# Patient Record
Sex: Female | Born: 1995 | Race: White | Hispanic: No | Marital: Married | Smoking: Never smoker
Health system: Southern US, Community
[De-identification: ages and names within clinical notes are randomized; demographics above are authoritative.]

## PROBLEM LIST (undated history)

## (undated) DIAGNOSIS — G43909 Migraine, unspecified, not intractable, without status migrainosus: Secondary | ICD-10-CM

## (undated) DIAGNOSIS — S060X9A Concussion with loss of consciousness of unspecified duration, initial encounter: Secondary | ICD-10-CM

## (undated) DIAGNOSIS — S060XAA Concussion with loss of consciousness status unknown, initial encounter: Secondary | ICD-10-CM

## (undated) DIAGNOSIS — T7840XA Allergy, unspecified, initial encounter: Secondary | ICD-10-CM

## (undated) DIAGNOSIS — J45909 Unspecified asthma, uncomplicated: Secondary | ICD-10-CM

## (undated) DIAGNOSIS — R000 Tachycardia, unspecified: Secondary | ICD-10-CM

## (undated) DIAGNOSIS — N926 Irregular menstruation, unspecified: Secondary | ICD-10-CM

## (undated) DIAGNOSIS — I10 Essential (primary) hypertension: Secondary | ICD-10-CM

## (undated) HISTORY — DX: Migraine, unspecified, not intractable, without status migrainosus: G43.909

## (undated) HISTORY — DX: Allergy, unspecified, initial encounter: T78.40XA

## (undated) HISTORY — PX: WISDOM TOOTH EXTRACTION: SHX21

## (undated) HISTORY — DX: Concussion with loss of consciousness of unspecified duration, initial encounter: S06.0X9A

## (undated) HISTORY — DX: Tachycardia, unspecified: R00.0

## (undated) HISTORY — DX: Unspecified asthma, uncomplicated: J45.909

## (undated) HISTORY — DX: Essential (primary) hypertension: I10

## (undated) HISTORY — DX: Concussion with loss of consciousness status unknown, initial encounter: S06.0XAA

## (undated) HISTORY — DX: Irregular menstruation, unspecified: N92.6

---
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
## 2015-03-10 ENCOUNTER — Ambulatory Visit (INDEPENDENT_AMBULATORY_CARE_PROVIDER_SITE_OTHER): Payer: 59 | Admitting: Physician Assistant

## 2015-03-10 ENCOUNTER — Ambulatory Visit
Admission: RE | Admit: 2015-03-10 | Discharge: 2015-03-10 | Disposition: A | Discharge status: Home / Self Care | Payer: 59 | Source: Ambulatory Visit | Attending: Physician Assistant | Admitting: Physician Assistant

## 2015-03-10 ENCOUNTER — Encounter: Payer: Self-pay | Admitting: Physician Assistant

## 2015-03-10 VITALS — BP 120/62 | HR 90 | Temp 98.7°F | Resp 16 | Wt 132.0 lb

## 2015-03-10 DIAGNOSIS — J45909 Unspecified asthma, uncomplicated: Secondary | ICD-10-CM | POA: Insufficient documentation

## 2015-03-10 DIAGNOSIS — R1032 Left lower quadrant pain: Secondary | ICD-10-CM

## 2015-03-10 DIAGNOSIS — S060X9A Concussion with loss of consciousness of unspecified duration, initial encounter: Secondary | ICD-10-CM | POA: Insufficient documentation

## 2015-03-10 DIAGNOSIS — R110 Nausea: Secondary | ICD-10-CM

## 2015-03-10 DIAGNOSIS — N926 Irregular menstruation, unspecified: Secondary | ICD-10-CM

## 2015-03-10 DIAGNOSIS — Z23 Encounter for immunization: Secondary | ICD-10-CM

## 2015-03-10 DIAGNOSIS — S139XXA Sprain of joints and ligaments of unspecified parts of neck, initial encounter: Secondary | ICD-10-CM | POA: Insufficient documentation

## 2015-03-10 DIAGNOSIS — R195 Other fecal abnormalities: Secondary | ICD-10-CM

## 2015-03-10 DIAGNOSIS — S060XAA Concussion with loss of consciousness status unknown, initial encounter: Secondary | ICD-10-CM | POA: Insufficient documentation

## 2015-03-10 DIAGNOSIS — S161XXA Strain of muscle, fascia and tendon at neck level, initial encounter: Secondary | ICD-10-CM | POA: Insufficient documentation

## 2015-03-10 DIAGNOSIS — J309 Allergic rhinitis, unspecified: Secondary | ICD-10-CM | POA: Insufficient documentation

## 2015-03-10 DIAGNOSIS — G43909 Migraine, unspecified, not intractable, without status migrainosus: Secondary | ICD-10-CM | POA: Insufficient documentation

## 2015-03-10 DIAGNOSIS — R030 Elevated blood-pressure reading, without diagnosis of hypertension: Secondary | ICD-10-CM | POA: Insufficient documentation

## 2015-03-10 DIAGNOSIS — IMO0002 Reserved for concepts with insufficient information to code with codable children: Secondary | ICD-10-CM | POA: Insufficient documentation

## 2015-03-10 DIAGNOSIS — R000 Tachycardia, unspecified: Secondary | ICD-10-CM | POA: Insufficient documentation

## 2015-03-10 MED ORDER — ESOMEPRAZOLE MAGNESIUM 20 MG PO CPDR: 20.0000 mg | DELAYED_RELEASE_CAPSULE | Freq: Every day | ORAL | Status: DC

## 2015-03-10 NOTE — Progress Notes (Signed)
Patient: April Griffin Female    DOB: 08/18/1995   19 y.o.   MRN: [id number] Visit Date: 03/10/2015  Today's Provider: Margaretann Loveless, PA-C   Chief Complaint  Patient presents with  . Nausea   Subjective:    HPI  April Griffin is a 20 yr old female that is here today with concerns about feeling nauseous every time she eats for the past couple of months, since November 2016. Patient feels like she is constantly hungry but gets sick after only eating a few bites. Per patient she has lost 10 lbs. Since November.  Patient states that she feels tired and that she is not trying to loose weight and try to eat. Per patient her menstrual cycle has been irregular in the past four months also. She has had irregular menses before and was placed on OCP but had her BP increase with that treatment. She also states that when she weightd approx 135-140 pounds her menstrual cycle was regular. Patient is still working out for the past couple of years same with the diet. No possibility of pregnancy, per patient is not sexually active in the past 2 years.      Allergies  Allergen Reactions  . Bee Venom   . Flexeril  [Cyclobenzaprine Hcl]     Tachycardia  . Latex    Previous Medications   MULTIPLE VITAMIN PO    Take by mouth.    Review of Systems  Constitutional: Positive for fatigue. Negative for fever and chills.  HENT: Negative.   Eyes: Negative.   Respiratory: Negative.   Cardiovascular: Negative.   Gastrointestinal: Positive for nausea, abdominal pain and diarrhea. Negative for vomiting (none recent, but maybe twice since November), constipation and blood in stool.  Endocrine: Negative.   Genitourinary: Positive for menstrual problem. Negative for dysuria, urgency, frequency, hematuria, flank pain, decreased urine volume, vaginal bleeding, vaginal discharge, genital sores, vaginal pain and pelvic pain.  Musculoskeletal: Negative.   Allergic/Immunologic: Negative.   Hematological:  Negative.   Psychiatric/Behavioral: Negative.   All other systems reviewed and are negative.   Social History  Substance Use Topics  . Smoking status: Never Smoker   . Smokeless tobacco: Never Used  . Alcohol Use: No   Objective:   BP 120/62 mmHg  Pulse 90  Temp(Src) 98.7 F (37.1 C) (Oral)  Resp 16  Wt 132 lb (59.875 kg)  LMP 02/24/2015  Physical Exam  Constitutional: She is oriented to person, place, and time. She appears well-developed and well-nourished. No distress.  HENT:  Head: Normocephalic and atraumatic.  Right Ear: External ear normal.  Left Ear: External ear normal.  Nose: Nose normal.  Mouth/Throat: Oropharynx is clear and moist. No oropharyngeal exudate.  Eyes: Conjunctivae and EOM are normal. Pupils are equal, round, and reactive to light. Right eye exhibits no discharge. Left eye exhibits no discharge. No scleral icterus.  Neck: Normal range of motion. Neck supple. No JVD present. No tracheal deviation present. No thyromegaly present.  Cardiovascular: Normal rate, regular rhythm, normal heart sounds and intact distal pulses.  Exam reveals no gallop and no friction rub.   No murmur heard. Pulmonary/Chest: Effort normal and breath sounds normal. No respiratory distress. She has no wheezes. She has no rales. She exhibits no tenderness.  Abdominal: Soft. Bowel sounds are normal. She exhibits no distension, no abdominal bruit, no ascites, no pulsatile midline mass and no mass. There is no hepatosplenomegaly. There is tenderness in the suprapubic area  and left lower quadrant. There is no rebound, no guarding and no CVA tenderness.  Musculoskeletal: Normal range of motion. She exhibits no edema or tenderness.  Lymphadenopathy:    She has no cervical adenopathy.  Neurological: She is alert and oriented to person, place, and time.  Skin: Skin is warm and dry. No rash noted. She is not diaphoretic.  Psychiatric: She has a normal mood and affect. Her behavior is normal.  Judgment and thought content normal.  Vitals reviewed.       Assessment & Plan:     1. Nausea Worsening and becoming more frequent. I will check labs as below and get an x-ray to rule out constipation. I will also go ahead and empirically start with Nexium in case there is a possible gastritis or stomach or duodenal ulcer as the cause. We will try the Nexium for 2-4 weeks to see if there is any improvement in symptoms. If there is not she is to call the office and we will proceed with GI referral for further evaluation and treatment. I did also give her 3 Hemoccult cards for her to do at home. I did offer her a rectal exam today in the office but she deferred. She may bring these back to the office once completed. Differential diagnosis includes constipation, gastric or duodenal ulcer, enteritis, inflammatory bowel disease such as Crohn's or ulcerative colitis, or irritable bowel syndrome. I will follow-up with her in 4 weeks to reevaluate. She is to call the office if symptoms worsen in the meantime. - CBC With Differential - Comprehensive Metabolic Panel (CMET) - Lipase - Amylase - DG Abd 2 Views; Future - esomeprazole (NEXIUM) 20 MG capsule; Take 1 capsule (20 mg total) by mouth daily.  Dispense: 30 capsule; Refill: 0  2. LLQ abdominal pain See above medical treatment plan. - DG Abd 2 Views; Future - esomeprazole (NEXIUM) 20 MG capsule; Take 1 capsule (20 mg total) by mouth daily.  Dispense: 30 capsule; Refill: 0  3. Loose stools See above medical treatment plan. - DG Abd 2 Views; Future  4. Irregular menses Possible cause of lower abdominal pain. She states that she has previously been on oral contraceptives and does not wish to go back on those at this time. She also does not wish to have any implants either. I will be checking labs to make sure that she has not developed any anemia from this. May consider further evaluation for uterine fibroids or other cause if all other stomach  evaluation is negative and symptoms persist.  5. Need for influenza vaccination Flu vaccine given today without complication. - Flu Vaccine QUAD 36+ mos IM       Margaretann Loveless, PA-C  [facility]

## 2015-03-10 NOTE — Patient Instructions (Signed)

## 2015-03-11 ENCOUNTER — Telehealth: Payer: Self-pay

## 2015-03-11 LAB — COMPREHENSIVE METABOLIC PANEL
ALT: 11 IU/L (ref 0–32)
AST: 18 IU/L (ref 0–40)
Albumin/Globulin Ratio: 1.7 (ref 1.1–2.5)
Albumin: 4.7 g/dL (ref 3.5–5.5)
Alkaline Phosphatase: 77 IU/L (ref 39–117)
BUN/Creatinine Ratio: 17 (ref 8–20)
BUN: 12 mg/dL (ref 6–20)
Bilirubin Total: <0.2 mg/dL (ref 0.0–1.2)
CO2: 25 mmol/L (ref 18–29)
Calcium: 9.3 mg/dL (ref 8.7–10.2)
Chloride: 103 mmol/L (ref 96–106)
Creatinine, Ser: 0.72 mg/dL (ref 0.57–1.00)
GFR calc Af Amer: 139 mL/min/{1.73_m2} (ref >59)
GFR calc non Af Amer: 121 mL/min/{1.73_m2} (ref >59)
Globulin, Total: 2.7 g/dL (ref 1.5–4.5)
Glucose: 86 mg/dL (ref 65–99)
Potassium: 4.5 mmol/L (ref 3.5–5.2)
Sodium: 144 mmol/L (ref 134–144)
Total Protein: 7.4 g/dL (ref 6.0–8.5)

## 2015-03-11 LAB — CBC WITH DIFFERENTIAL
Basophils Absolute: 0.1 10*3/uL (ref 0.0–0.2)
Basos: 1 %
EOS (ABSOLUTE): 0.2 10*3/uL (ref 0.0–0.4)
Eos: 2 %
Hematocrit: 40.7 % (ref 34.0–46.6)
Hemoglobin: 13.3 g/dL (ref 11.1–15.9)
Immature Grans (Abs): 0 10*3/uL (ref 0.0–0.1)
Immature Granulocytes: 0 %
Lymphocytes Absolute: 1.9 10*3/uL (ref 0.7–3.1)
Lymphs: 23 %
MCH: 28.5 pg (ref 26.6–33.0)
MCHC: 32.7 g/dL (ref 31.5–35.7)
MCV: 87 fL (ref 79–97)
Monocytes Absolute: 0.7 10*3/uL (ref 0.1–0.9)
Monocytes: 9 %
Neutrophils Absolute: 5.4 10*3/uL (ref 1.4–7.0)
Neutrophils: 65 %
RBC: 4.66 x10E6/uL (ref 3.77–5.28)
RDW: 13.8 % (ref 12.3–15.4)
WBC: 8.2 10*3/uL (ref 3.4–10.8)

## 2015-03-11 LAB — LIPASE: Lipase: 26 U/L (ref 0–59)

## 2015-03-11 LAB — AMYLASE: Amylase: 37 U/L (ref 31–124)

## 2015-03-11 NOTE — Telephone Encounter (Signed)
-----   Message from Margaretann Loveless, PA-C sent at 03/11/2015  8:18 AM EST ----- Labs are WNL.

## 2015-03-11 NOTE — Telephone Encounter (Signed)
Patient advised as directed below.  Thanks,  -Joseline 

## 2015-04-07 ENCOUNTER — Ambulatory Visit (INDEPENDENT_AMBULATORY_CARE_PROVIDER_SITE_OTHER): Payer: 59 | Admitting: Physician Assistant

## 2015-04-07 ENCOUNTER — Encounter: Payer: Self-pay | Admitting: Physician Assistant

## 2015-04-07 VITALS — BP 110/60 | HR 80 | Temp 98.4°F | Resp 16 | Wt 134.2 lb

## 2015-04-07 DIAGNOSIS — Z113 Encounter for screening for infections with a predominantly sexual mode of transmission: Secondary | ICD-10-CM | POA: Diagnosis not present

## 2015-04-07 DIAGNOSIS — K5900 Constipation, unspecified: Secondary | ICD-10-CM

## 2015-04-07 NOTE — Progress Notes (Signed)
Patient: April Griffin Female    DOB: [birth year]   20 y.o.   MRN: [id number] Visit Date: 04/07/2015  Today's Provider: Margaretann Loveless, PA-C   Chief Complaint  Patient presents with  . Follow-up    Nausea   Subjective:    HPI April Griffin is here for her 4 week follow-up appointment. She reports her nausea and her abdominal pain is better. She does get nauseous sometimes specially when waits long to eat. Feels a lot better however. Menstrual cycle is trying to regulate again.  She has not taken Nexium. She did use MiraLAX for a couple of days but did not like the way it made her feel. She has been controlling her bowel movements with dietary changes. She also states that she does have a new partner. They have not been sexually active as of yet but she would like to get tested for STDs prior to any sexual activity with him so that she knows that she is okay before entering a sexual relationship. She has been sexually active in the past but has been a couple of years since her last sexual encounter.    Allergies  Allergen Reactions  . Bee Venom   . Flexeril  [Cyclobenzaprine Hcl]     Tachycardia  . Latex    Previous Medications   ESOMEPRAZOLE (NEXIUM) 20 MG CAPSULE    Take 1 capsule (20 mg total) by mouth daily.   MULTIPLE VITAMIN PO    Take by mouth. Reported on 04/07/2015    Review of Systems  Constitutional: Negative.   HENT: Negative.   Respiratory: Negative.   Cardiovascular: Negative.   Gastrointestinal: Positive for nausea (improving). Negative for abdominal pain.  Genitourinary: Negative.   Neurological: Negative.     Social History  Substance Use Topics  . Smoking status: Never Smoker   . Smokeless tobacco: Never Used  . Alcohol Use: No   Objective:   BP 110/60 mmHg  Pulse 80  Temp(Src) 98.4 F (36.9 C) (Oral)  Resp 16  Wt 134 lb 3.2 oz (60.873 kg)  LMP 03/27/2015  Physical Exam  Constitutional: She is oriented to person, place, and time. She  appears well-developed and well-nourished. No distress.  Cardiovascular: Normal rate, regular rhythm and normal heart sounds.  Exam reveals no gallop and no friction rub.   No murmur heard. Pulmonary/Chest: Effort normal and breath sounds normal. No respiratory distress. She has no wheezes. She has no rales.  Abdominal: Soft. Normal appearance and bowel sounds are normal. She exhibits no distension and no mass. There is no hepatosplenomegaly. There is no tenderness. There is no rebound, no guarding and no CVA tenderness.  Neurological: She is alert and oriented to person, place, and time.  Skin: Skin is warm and dry. She is not diaphoretic.        Assessment & Plan:     1. Screen for STD (sexually transmitted disease) Will perform STD testing as below and follow-up with her pending these results. I will see her back in 4-5 months for her complete annual physical including initial Pap smear at that time as well. She is to call the office if she has any acute issues, questions or concerns in the meantime. - HIV antibody (with reflex) - GC/Chlamydia Probe Amp - HSV(herpes simplex vrs) 1+2 ab-IgG  2. Constipation, unspecified constipation type Improving. At this time she is only using diet changes to improve her bowel habits. She has increased her water  intake as well. I did advise her that she could always add a stool softener instead of a laxative to help keep her bowel movements regular as well. She is to call the office if symptoms worsen again.       Margaretann Loveless, PA-C  [facility]

## 2015-04-08 LAB — HSV(HERPES SIMPLEX VRS) I + II AB-IGG
HSV 1 Glycoprotein G Ab, IgG: <0.91 index (ref 0.00–0.90)
HSV 2 Glycoprotein G Ab, IgG: <0.91 index (ref 0.00–0.90)

## 2015-04-08 LAB — HIV ANTIBODY (ROUTINE TESTING W REFLEX): HIV Screen 4th Generation wRfx: NONREACTIVE

## 2015-04-08 LAB — GC/CHLAMYDIA PROBE AMP
Chlamydia trachomatis, NAA: NEGATIVE
Neisseria gonorrhoeae by PCR: NEGATIVE

## 2015-04-09 ENCOUNTER — Telehealth: Payer: Self-pay

## 2015-04-09 NOTE — Telephone Encounter (Signed)
Patient advised as directed below.  Thanks,  -Joseline 

## 2015-04-09 NOTE — Telephone Encounter (Signed)
-----   Message from Margaretann Loveless, [place] sent at 04/08/2015  5:22 PM EST ----- GC/chlamydia negative also

## 2015-08-07 ENCOUNTER — Encounter: Payer: 59 | Admitting: Physician Assistant

## 2017-11-02 DIAGNOSIS — L858 Other specified epidermal thickening: Secondary | ICD-10-CM | POA: Diagnosis not present

## 2017-11-02 DIAGNOSIS — D1801 Hemangioma of skin and subcutaneous tissue: Secondary | ICD-10-CM | POA: Diagnosis not present

## 2017-11-02 DIAGNOSIS — D1800 Hemangioma unspecified site: Secondary | ICD-10-CM | POA: Diagnosis not present

## 2018-04-25 ENCOUNTER — Encounter: Payer: 59 | Admitting: Physician Assistant
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
## 2018-05-02 ENCOUNTER — Other Ambulatory Visit: Payer: Self-pay | Admitting: Physician Assistant

## 2018-05-02 ENCOUNTER — Other Ambulatory Visit: Payer: Self-pay

## 2018-05-02 ENCOUNTER — Encounter: Payer: Self-pay | Admitting: Physician Assistant

## 2018-05-02 ENCOUNTER — Ambulatory Visit (INDEPENDENT_AMBULATORY_CARE_PROVIDER_SITE_OTHER): Payer: BLUE CROSS/BLUE SHIELD | Admitting: Physician Assistant

## 2018-05-02 VITALS — BP 118/78 | HR 76 | Temp 98.6°F | Ht 65.0 in | Wt 136.0 lb

## 2018-05-02 DIAGNOSIS — Z0000 Encounter for general adult medical examination without abnormal findings: Secondary | ICD-10-CM

## 2018-05-02 DIAGNOSIS — Z124 Encounter for screening for malignant neoplasm of cervix: Secondary | ICD-10-CM | POA: Diagnosis not present

## 2018-05-02 DIAGNOSIS — E282 Polycystic ovarian syndrome: Secondary | ICD-10-CM | POA: Diagnosis not present

## 2018-05-02 DIAGNOSIS — R102 Pelvic and perineal pain: Secondary | ICD-10-CM | POA: Diagnosis not present

## 2018-05-02 NOTE — Patient Instructions (Signed)

## 2018-05-03 LAB — CBC WITH DIFFERENTIAL/PLATELET
Basophils Absolute: 0.1 10*3/uL (ref 0.0–0.2)
Basos: 1 %
EOS (ABSOLUTE): 0.1 10*3/uL (ref 0.0–0.4)
Eos: 1 %
Hematocrit: 38.8 % (ref 34.0–46.6)
Hemoglobin: 13.5 g/dL (ref 11.1–15.9)
Immature Grans (Abs): 0 10*3/uL (ref 0.0–0.1)
Immature Granulocytes: 0 %
Lymphocytes Absolute: 1.8 10*3/uL (ref 0.7–3.1)
Lymphs: 36 %
MCH: 30.8 pg (ref 26.6–33.0)
MCHC: 34.8 g/dL (ref 31.5–35.7)
MCV: 89 fL (ref 79–97)
Monocytes Absolute: 0.4 10*3/uL (ref 0.1–0.9)
Monocytes: 8 %
Neutrophils Absolute: 2.7 10*3/uL (ref 1.4–7.0)
Neutrophils: 54 %
Platelets: 187 10*3/uL (ref 150–450)
RBC: 4.38 x10E6/uL (ref 3.77–5.28)
RDW: 12.3 % (ref 11.7–15.4)
WBC: 5 10*3/uL (ref 3.4–10.8)

## 2018-05-03 LAB — COMPREHENSIVE METABOLIC PANEL
ALT: 9 IU/L (ref 0–32)
AST: 15 IU/L (ref 0–40)
Albumin/Globulin Ratio: 1.8 (ref 1.2–2.2)
Albumin: 4.4 g/dL (ref 3.9–5.0)
Alkaline Phosphatase: 72 IU/L (ref 39–117)
BUN/Creatinine Ratio: 13 (ref 9–23)
BUN: 9 mg/dL (ref 6–20)
Bilirubin Total: 0.3 mg/dL (ref 0.0–1.2)
CO2: 23 mmol/L (ref 20–29)
Calcium: 8.7 mg/dL (ref 8.7–10.2)
Chloride: 103 mmol/L (ref 96–106)
Creatinine, Ser: 0.68 mg/dL (ref 0.57–1.00)
GFR calc Af Amer: 143 mL/min/{1.73_m2} (ref >59)
GFR calc non Af Amer: 124 mL/min/{1.73_m2} (ref >59)
Globulin, Total: 2.5 g/dL (ref 1.5–4.5)
Glucose: 88 mg/dL (ref 65–99)
Potassium: 4 mmol/L (ref 3.5–5.2)
Sodium: 141 mmol/L (ref 134–144)
Total Protein: 6.9 g/dL (ref 6.0–8.5)

## 2018-05-03 LAB — LIPID PANEL WITH LDL/HDL RATIO
Cholesterol, Total: 138 mg/dL (ref 100–199)
HDL: 62 mg/dL (ref >39)
LDL Calculated: 62 mg/dL (ref 0–99)
LDl/HDL Ratio: 1 ratio (ref 0.0–3.2)
Triglycerides: 70 mg/dL (ref 0–149)
VLDL Cholesterol Cal: 14 mg/dL (ref 5–40)

## 2018-05-03 LAB — TSH: TSH: 1.25 u[IU]/mL (ref 0.450–4.500)

## 2018-05-04 ENCOUNTER — Telehealth: Payer: Self-pay

## 2018-05-04 NOTE — Telephone Encounter (Signed)
Left detailed vm.  Form filled out and faxed and copy if ready for patient.

## 2018-05-04 NOTE — Telephone Encounter (Signed)
-----   Message from Margaretann Loveless, [place] sent at 05/04/2018  8:35 AM EDT ----- Blood count is normal. Kidney and liver function is normal. Sugar normal. Cholesterol normal. Thyroid is normal.

## 2018-05-04 NOTE — Telephone Encounter (Signed)
[id number] 

## 2018-05-04 NOTE — Telephone Encounter (Signed)
Pt Advised of labs.  She had paperwork left that needed to be filled out.  Please call when ready.

## 2018-05-07 ENCOUNTER — Other Ambulatory Visit: Payer: Self-pay | Admitting: Physician Assistant

## 2018-05-07 ENCOUNTER — Ambulatory Visit
Admission: RE | Admit: 2018-05-07 | Discharge: 2018-05-07 | Disposition: A | Discharge status: Home / Self Care | Payer: BLUE CROSS/BLUE SHIELD | Source: Ambulatory Visit | Attending: Physician Assistant | Admitting: Physician Assistant

## 2018-05-07 ENCOUNTER — Telehealth: Payer: Self-pay

## 2018-05-07 ENCOUNTER — Other Ambulatory Visit: Payer: Self-pay

## 2018-05-07 DIAGNOSIS — E282 Polycystic ovarian syndrome: Secondary | ICD-10-CM | POA: Diagnosis not present

## 2018-05-07 DIAGNOSIS — R102 Pelvic and perineal pain: Secondary | ICD-10-CM | POA: Diagnosis not present

## 2018-05-07 NOTE — Telephone Encounter (Signed)
-----   Message from Margaretann Loveless, [place] sent at 05/07/2018  1:26 PM EDT ----- Pelvic US is unremarkable. No ovarian cyst or uterine fibroids noted now. Are you still having the RLQ and pelvic abdominal pain.

## 2018-05-07 NOTE — Telephone Encounter (Signed)
[email or web address]

## 2018-05-09 LAB — PAPLB, CTNGTV, HPV, RFX16/18
Chlamydia, Nuc. Acid Amp: NEGATIVE
Gonococcus, Nuc. Acid Amp: NEGATIVE
HPV, high-risk: NEGATIVE
PAP Smear Comment: 0
Trich vag by NAA: NEGATIVE

## 2018-05-09 NOTE — Telephone Encounter (Signed)
Patient advised as below.  

## 2018-05-16 ENCOUNTER — Telehealth: Payer: Self-pay

## 2018-05-16 NOTE — Telephone Encounter (Signed)
-----   Message from Margaretann Loveless, [place] sent at 05/14/2018  3:51 PM EDT ----- Pap is normal.  Will repeat in 3 years.

## 2018-05-16 NOTE — Telephone Encounter (Signed)
[email or web address]

## 2018-05-17 NOTE — Telephone Encounter (Signed)
Spoke to patient about lab results. She will follow up in 3 to 5 years for next exam.

## 2019-03-19 DIAGNOSIS — D485 Neoplasm of uncertain behavior of skin: Secondary | ICD-10-CM | POA: Diagnosis not present

## 2019-03-19 DIAGNOSIS — L720 Epidermal cyst: Secondary | ICD-10-CM | POA: Diagnosis not present

## 2019-03-19 DIAGNOSIS — D229 Melanocytic nevi, unspecified: Secondary | ICD-10-CM | POA: Diagnosis not present

## 2019-04-04 DIAGNOSIS — D235 Other benign neoplasm of skin of trunk: Secondary | ICD-10-CM | POA: Diagnosis not present

## 2019-04-08 ENCOUNTER — Other Ambulatory Visit: Payer: Self-pay

## 2019-04-08 ENCOUNTER — Ambulatory Visit
Admission: EM | Admit: 2019-04-08 | Discharge: 2019-04-08 | Disposition: A | Discharge status: Home / Self Care | Payer: BC Managed Care – PPO | Attending: Family Medicine | Admitting: Family Medicine

## 2019-04-08 DIAGNOSIS — T8149XA Infection following a procedure, other surgical site, initial encounter: Secondary | ICD-10-CM | POA: Diagnosis not present

## 2019-04-08 MED ORDER — ONDANSETRON 4 MG PO TBDP: 4.0000 mg | ORAL_TABLET | Freq: Three times a day (TID) | ORAL | 0 refills | Status: AC | PRN

## 2019-04-08 MED ORDER — DOXYCYCLINE HYCLATE 100 MG PO CAPS: 100.0000 mg | ORAL_CAPSULE | Freq: Two times a day (BID) | ORAL | 0 refills | Status: DC

## 2019-04-08 MED ORDER — ONDANSETRON 4 MG PO TBDP: 4.0000 mg | ORAL_TABLET | Freq: Three times a day (TID) | ORAL | 0 refills | Status: DC | PRN
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
## 2019-04-08 NOTE — ED Triage Notes (Signed)
Pt had dermatological procedure done on abd for some skin cancer on Thursday. Started Friday with pain, swelling, discharge to procedure site on her abdomen.

## 2019-04-08 NOTE — Discharge Instructions (Addendum)
Care for wound as directed by your dermatologist. Keep covered to keep clean I have collected a culture to ensure adequate antibiotic coverage.  I would recommend following up with your dermatologist in the next week for a recheck.  If worsening don't hesitate to return.  The antibiotics may cause some stomach upset. Zofran every 8 hours as needed for nausea or vomiting.

## 2019-04-10 LAB — AEROBIC CULTURE W GRAM STAIN (SUPERFICIAL SPECIMEN)

## 2019-04-10 LAB — AEROBIC CULTURE? (SUPERFICIAL SPECIMEN)

## 2019-05-31 ENCOUNTER — Ambulatory Visit: Payer: BC Managed Care – PPO | Attending: Oncology

## 2019-05-31 DIAGNOSIS — Z23 Encounter for immunization: Secondary | ICD-10-CM

## 2019-05-31 NOTE — Progress Notes (Signed)
   Covid-19 Vaccination Clinic  Name:  ZAELYN NOACK    MRN: [id number] DOB: [birth year]  05/31/2019  Ms. Ebron was observed post Covid-19 immunization for 15 minutes without incident. She was provided with Vaccine Information Sheet and instruction to access the V-Safe system.   Ms. Teaney was instructed to call 911 with any severe reactions post vaccine: Marland Kitchen Difficulty breathing  . Swelling of face and throat  . A fast heartbeat  . A bad rash all over body  . Dizziness and weakness   Immunizations Administered    Name Date Dose VIS Date Route   Pfizer COVID-19 Vaccine 05/31/2019  8:15 AM 0.3 mL 02/01/2019 Intramuscular   Manufacturer: [employer], [employer]   Lot: [id number]   NDC: [address]-8
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
## 2019-06-14 ENCOUNTER — Other Ambulatory Visit: Payer: Self-pay

## 2019-06-14 ENCOUNTER — Encounter: Payer: Self-pay | Admitting: Physician Assistant

## 2019-06-14 ENCOUNTER — Ambulatory Visit: Payer: BC Managed Care – PPO | Admitting: Physician Assistant

## 2019-06-14 VITALS — BP 139/91 | HR 81 | Temp 97.6°F | Resp 16 | Wt 139.8 lb

## 2019-06-14 DIAGNOSIS — Z842 Family history of other diseases of the genitourinary system: Secondary | ICD-10-CM

## 2019-06-14 DIAGNOSIS — N946 Dysmenorrhea, unspecified: Secondary | ICD-10-CM | POA: Diagnosis not present

## 2019-06-14 DIAGNOSIS — F4321 Adjustment disorder with depressed mood: Secondary | ICD-10-CM | POA: Diagnosis not present

## 2019-06-14 NOTE — Patient Instructions (Signed)
Premenstrual Syndrome Premenstrual syndrome (PMS) is a group of physical, emotional, and behavioral symptoms that affect women of childbearing age as part of their menstrual cycle. PMS starts 1-2 weeks before the start of a woman's menstrual period and goes away a few days after menstrual bleeding starts. It often happens in a predictable pattern (recurs). PMS may cause other health conditions to become worse, such as asthma, allergies, and migraines. PMS can range from mild to severe. When it is severe, it is called premenstrual dysphoric disorder (PMDD). PMS may interfere with normal daily activities. What are the causes? The cause of this condition is not known, but it seems to be related to hormone changes that happen before menstruation. What are the signs or symptoms? Symptoms of this condition often happen every month. They go away completely after your period starts. Physical symptoms of this condition include:  Bloating.  Breast pain.  Headaches.  Extreme fatigue.  Backaches.  Swelling of the hands and feet.  Weight gain.  Hot flashes. Emotional and behavioral symptoms of this condition include:  Mood swings.  Depression.  Angry outbursts.  Irritability.  Anxiety.  Crying spells.  Food cravings or appetite changes.  Changes in sexual desire.  Confusion.  Aggression.  Social withdrawal.  Poor concentration. How is this diagnosed? This condition may be diagnosed based on a history of your symptoms. This condition is generally diagnosed if symptoms of PMS:  Are present in the 5 days before your period starts.  End within 4 days after your period starts.  Happen at least 3 months in a row.  Interfere with some of your normal activities. Other conditions that can cause some of these symptoms must be ruled out before PMS can be diagnosed. These include depression, anxiety, anemia, and thyroid problems. How is this treated? This condition may be treated  by:  Maintaining a healthy lifestyle. This includes eating a well-balanced diet and exercising regularly.  Taking medicines. Medicines can help relieve symptoms such as cramps, aches, pains, headaches, and breast tenderness. Depending on the severity of the condition, your health care provider may recommend various over-the-counter pain medicines. Follow these instructions at home: Eating and drinking   Eat a well-balanced diet.  Avoid caffeine and alcohol.  Limit the amount of salt and salty foods you eat. This will help reduce bloating.  Drink enough fluid to keep your urine pale yellow.  Take a multivitamin if told to do so by your health care provider. Lifestyle   Do not use any products that contain nicotine or tobacco, such as cigarettes, e-cigarettes, and chewing tobacco. If you need help quitting, ask your health care provider.  Exercise regularly as suggested by your health care provider.  Get enough sleep. For most adults, this is 7-8 hours of sleep each night.  Practice relaxation techniques such as yoga, tai chi, or meditation.  Find healthy ways to manage stress. General instructions   For 2-3 months, write down your symptoms, their severity, and how long they last. This will help your health care provider choose the best treatment for you.  Take over-the-counter and prescription medicines only as told by your health care provider.  If you are using birth control pills (oral contraceptives), use them as told by your health care provider. Contact a health care provider if:  Your symptoms get worse.  You develop new symptoms.  You have trouble doing your daily activities. Summary  Premenstrual syndrome (PMS) is a group of physical, emotional, and behavioral symptoms that   affect women of childbearing age.  PMS starts 1-2 weeks before the start of a woman's period and goes away a few days after the period starts.  PMS is treated by maintaining a healthy  lifestyle and taking medicines to relieve the symptoms. This information is not intended to replace advice given to you by your health care provider. Make sure you discuss any questions you have with your health care provider. Document Revised: 09/20/2017 Document Reviewed: 09/20/2017 Elsevier Patient Education  [employer]. Endometriosis  Endometriosis is a condition in which the tissue that lines the uterus (endometrium) grows outside of its normal location. The tissue may grow in many locations close to the uterus, but it commonly grows on the ovaries, fallopian tubes, vagina, or bowel. When the uterus sheds the endometrium every menstrual cycle, there is bleeding wherever the endometrial tissue is located. This can cause pain because blood is irritating to tissues that are not normally exposed to it. What are the causes? The cause of endometriosis is not known. What increases the risk? You may be more likely to develop endometriosis if you:  Have a family history of endometriosis.  Have never given birth.  Started your period at age 76 or younger.  Have high levels of estrogen in your body.  Were exposed to a certain medicine (diethylstilbestrol) before you were born (in utero).  Had low birth weight.  Were born as a twin, triplet, or other multiple.  Have a BMI of less than 25. BMI is an estimate of body fat and is calculated from height and weight. What are the signs or symptoms? Often, there are no symptoms of this condition. If you do have symptoms, they may:  Vary depending on where your endometrial tissue is growing.  Occur during your menstrual period (most common) or midcycle.  Come and go, or you may go months with no symptoms at all.  Stop with menopause. Symptoms may include:  Pain in the back or abdomen.  Heavier bleeding during periods.  Pain during sex.  Painful bowel movements.  Infertility.  Pelvic pain.  Bleeding more than once a  month. How is this diagnosed? This condition is diagnosed based on your symptoms and a physical exam. You may have tests, such as:  Blood tests and urine tests. These may be done to help rule out other possible causes of your symptoms.  Ultrasound, to look for abnormal tissues.  An X-ray of the lower bowel (barium enema).  An ultrasound that is done through the vagina (transvaginally).  CT scan.  MRI.  Laparoscopy. In this procedure, a lighted, pencil-sized instrument called a laparoscope is inserted into your abdomen through an incision. The laparoscope allows your health care provider to look at the organs inside your body and check for abnormal tissue to confirm the diagnosis. If abnormal tissue is found, your health care provider may remove a small piece of tissue (biopsy) to be examined under a microscope. How is this treated? Treatment for this condition may include:  Medicines to relieve pain, such as NSAIDs.  Hormone therapy. This involves using artificial (synthetic) hormones to reduce endometrial tissue growth. Your health care provider may recommend using a hormonal form of birth control, or other medicines.  Surgery. This may be done to remove abnormal endometrial tissue. ? In some cases, tissue may be removed using a laparoscope and a laser (laparoscopic laser treatment). ? In severe cases, surgery may be done to remove the fallopian tubes, uterus, and ovaries (hysterectomy). Follow  these instructions at home:  Take over-the-counter and prescription medicines only as told by your health care provider.  Do not drive or use heavy machinery while taking prescription pain medicine.  Try to avoid activities that cause pain, including sexual activity.  Keep all follow-up visits as told by your health care provider. This is important. Contact a health care provider if:  You have pain in the area between your hip bones (pelvic area) that occurs: ? Before, during, or after  your period. ? In between your period and gets worse during your period. ? During or after sex. ? With bowel movements or urination, especially during your period.  You have problems getting pregnant.  You have a fever. Get help right away if:  You have severe pain that does not get better with medicine.  You have severe nausea and vomiting, or you cannot eat without vomiting.  You have pain that affects only the lower, right side of your abdomen.  You have abdominal pain that gets worse.  You have abdominal swelling.  You have blood in your stool. This information is not intended to replace advice given to you by your health care provider. Make sure you discuss any questions you have with your health care provider. Document Revised: 01/20/2017 Document Reviewed: 07/11/2015 Elsevier Patient Education  2020 [employer].

## 2019-06-20 ENCOUNTER — Encounter: Payer: Self-pay | Admitting: Physician Assistant

## 2019-06-20 DIAGNOSIS — Z30011 Encounter for initial prescription of contraceptive pills: Secondary | ICD-10-CM

## 2019-06-20 MED ORDER — NORETHINDRONE 0.35 MG PO TABS: 1.0000 | ORAL_TABLET | Freq: Every day | ORAL | 11 refills | Status: DC

## 2019-06-20 MED ORDER — NORETHINDRONE 0.35 MG PO TABS: 1.0000 | ORAL_TABLET | Freq: Every day | ORAL | 11 refills | Status: AC

## 2019-06-20 NOTE — Telephone Encounter (Signed)
Rx canceled at [facility].

## 2019-06-20 NOTE — Addendum Note (Signed)
Addended by: Margaretann Loveless on: 06/20/2019 12:17 PM   Modules accepted: Orders

## 2019-06-20 NOTE — Telephone Encounter (Signed)
April Griffin,  Can we please call [facility] in [place] and cancel the [facility] Rx. I accidentally sent. I changed it to [facility].  JB

## 2019-06-20 NOTE — Addendum Note (Signed)
Addended by: Margaretann Loveless on: 06/20/2019 01:45 PM   Modules accepted: Orders

## 2019-06-21 DIAGNOSIS — D225 Melanocytic nevi of trunk: Secondary | ICD-10-CM | POA: Diagnosis not present

## 2019-06-21 DIAGNOSIS — D2271 Melanocytic nevi of right lower limb, including hip: Secondary | ICD-10-CM | POA: Diagnosis not present

## 2019-06-21 DIAGNOSIS — D2262 Melanocytic nevi of left upper limb, including shoulder: Secondary | ICD-10-CM | POA: Diagnosis not present

## 2019-06-21 DIAGNOSIS — D2261 Melanocytic nevi of right upper limb, including shoulder: Secondary | ICD-10-CM | POA: Diagnosis not present

## 2019-06-26 ENCOUNTER — Ambulatory Visit: Payer: BC Managed Care – PPO | Attending: Internal Medicine

## 2019-06-26 ENCOUNTER — Other Ambulatory Visit: Payer: Self-pay

## 2019-06-26 DIAGNOSIS — Z23 Encounter for immunization: Secondary | ICD-10-CM

## 2019-06-26 NOTE — Progress Notes (Signed)
   Covid-19 Vaccination Clinic  Name:  April Griffin    MRN: [id number] DOB: May 30, 1995  06/26/2019  [name] was observed post Covid-19 immunization for 15 minutes without incident. She was provided with Vaccine Information Sheet and instruction to access the V-Safe system.   [name] was instructed to call 911 with any severe reactions post vaccine: Marland Kitchen Difficulty breathing  . Swelling of face and throat  . A fast heartbeat  . A bad rash all over body  . Dizziness and weakness   Immunizations Administered    Name Date Dose VIS Date Route   Pfizer COVID-19 Vaccine 06/26/2019  8:27 AM 0.3 mL 04/17/2018 Intramuscular   Manufacturer: [employer], [employer]   Lot: [id number]   NDC: [address]-6

## 2019-08-10 DIAGNOSIS — Z111 Encounter for screening for respiratory tuberculosis: Secondary | ICD-10-CM | POA: Diagnosis not present

## 2019-08-12 DIAGNOSIS — Z111 Encounter for screening for respiratory tuberculosis: Secondary | ICD-10-CM | POA: Diagnosis not present

## 2019-08-18 DIAGNOSIS — Z111 Encounter for screening for respiratory tuberculosis: Secondary | ICD-10-CM | POA: Diagnosis not present

## 2019-08-20 DIAGNOSIS — Z111 Encounter for screening for respiratory tuberculosis: Secondary | ICD-10-CM | POA: Diagnosis not present

## 2019-09-06 ENCOUNTER — Ambulatory Visit: Payer: BC Managed Care – PPO | Admitting: Physician Assistant

## 2019-09-06 ENCOUNTER — Encounter: Payer: Self-pay | Admitting: Physician Assistant

## 2019-09-06 ENCOUNTER — Other Ambulatory Visit: Payer: Self-pay

## 2019-09-06 VITALS — BP 123/84 | HR 77 | Temp 97.3°F | Resp 16 | Wt 141.4 lb

## 2019-09-06 DIAGNOSIS — Z113 Encounter for screening for infections with a predominantly sexual mode of transmission: Secondary | ICD-10-CM
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
## 2019-09-09 DIAGNOSIS — Z113 Encounter for screening for infections with a predominantly sexual mode of transmission: Secondary | ICD-10-CM | POA: Diagnosis not present

## 2019-09-10 ENCOUNTER — Telehealth: Payer: Self-pay

## 2019-09-10 LAB — STD SCREEN (8)
HIV Screen 4th Generation wRfx: NONREACTIVE
HSV 1 Glycoprotein G Ab, IgG: <0.91 index (ref 0.00–0.90)
HSV 2 IgG, Type Spec: <0.91 index (ref 0.00–0.90)
Hep A IgM: NEGATIVE
Hep B C IgM: NEGATIVE
Hep C Virus Ab: <0.1 s/co ratio (ref 0.0–0.9)
Hepatitis B Surface Ag: NEGATIVE
RPR Ser Ql: NONREACTIVE

## 2019-09-10 NOTE — Telephone Encounter (Signed)
Seen by patient [name] on 09/10/2019 11:05 AM

## 2019-09-10 NOTE — Telephone Encounter (Signed)
-----   Message from Margaretann Loveless, [place] sent at 09/10/2019 10:39 AM EDT ----- Labs are negative for any STD thus far. The gonorrhea, chlamydia and trich are still pending. The HSV antibodies is negative so I do not think you have been exposed yet.

## 2019-09-11 LAB — CHLAMYDIA/GONOCOCCUS/TRICHOMONAS, NAA
Chlamydia by NAA: NEGATIVE
Gonococcus by NAA: NEGATIVE
Trich vag by NAA: NEGATIVE

## 2019-09-12 ENCOUNTER — Telehealth: Payer: Self-pay

## 2019-09-12 NOTE — Telephone Encounter (Signed)
Patient advised as directed below. 

## 2019-09-12 NOTE — Telephone Encounter (Signed)
-----   Message from Margaretann Loveless, PA-C sent at 09/12/2019  7:40 AM EDT ----- Negative screen for gonorrhea, chlamydia and trichomonas.

## 2019-10-17 ENCOUNTER — Encounter: Payer: Self-pay | Admitting: Physician Assistant

## 2019-10-17 ENCOUNTER — Telehealth: Payer: Self-pay

## 2019-10-17 NOTE — Telephone Encounter (Signed)
Copied from CRM [id number]. 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

## 2019-10-17 NOTE — Telephone Encounter (Signed)
Patient sent a my chart message to provider

## 2019-10-21 NOTE — Telephone Encounter (Signed)
Forms completed

## 2019-10-25 ENCOUNTER — Ambulatory Visit (INDEPENDENT_AMBULATORY_CARE_PROVIDER_SITE_OTHER): Payer: BC Managed Care – PPO | Admitting: Physician Assistant

## 2019-10-25 ENCOUNTER — Other Ambulatory Visit: Payer: Self-pay

## 2019-10-25 DIAGNOSIS — Z23 Encounter for immunization: Secondary | ICD-10-CM

## 2019-11-05 NOTE — Progress Notes (Signed)
Tdap Vaccine given to patient without complications. Patient sat for 15 minutes after administration and was tolerated well without adverse effects. 

## 2020-01-02 ENCOUNTER — Encounter: Payer: Self-pay | Admitting: Physician Assistant

## 2020-01-06 ENCOUNTER — Ambulatory Visit: Payer: BC Managed Care – PPO | Admitting: Physician Assistant

## 2020-01-18 IMAGING — US US PELVIS COMPLETE
1 series · 14 of 25 positions shown · non-contrast
Comparison: None.

CLINICAL DATA: Initial evaluation for pelvic pain, PCOS.

EXAM:
TRANSABDOMINAL ULTRASOUND OF PELVIS
TECHNIQUE: Transabdominal ultrasound examination of the pelvis was performed
including evaluation of the uterus, ovaries, adnexal regions, and
pelvic cul-de-sac.

[Series 1: us pelvis complete · 0.21mm/px · 14 of 39 slices shown]
[im 1/39]
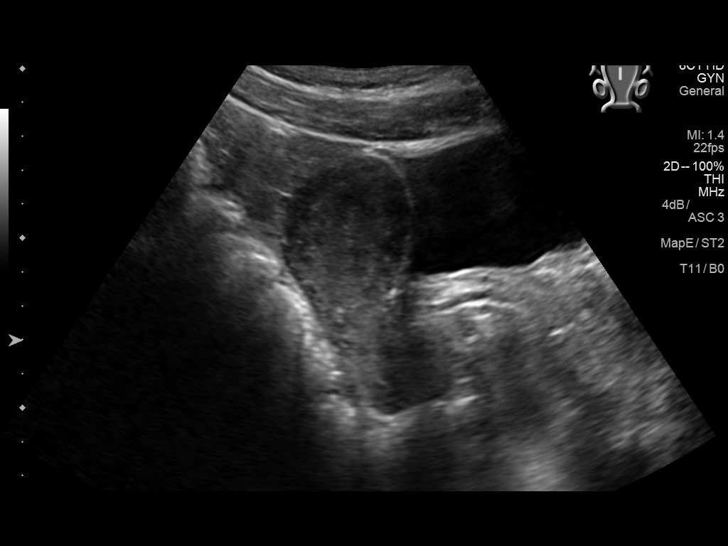
[im 4/39]
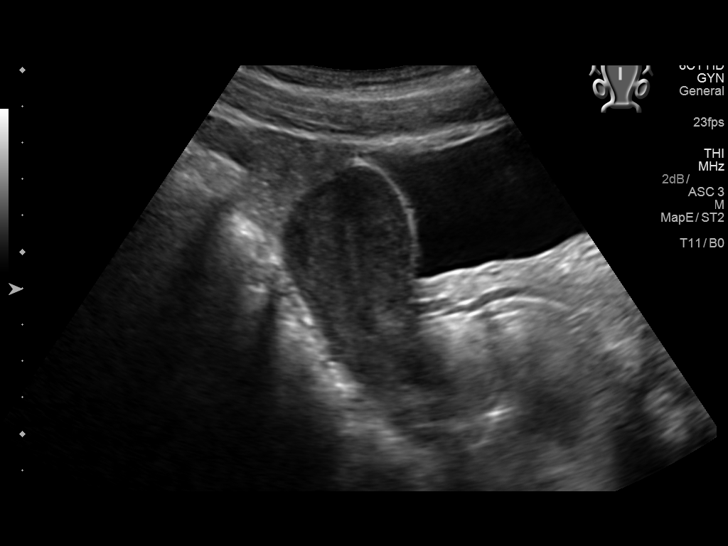
[im 7/39]
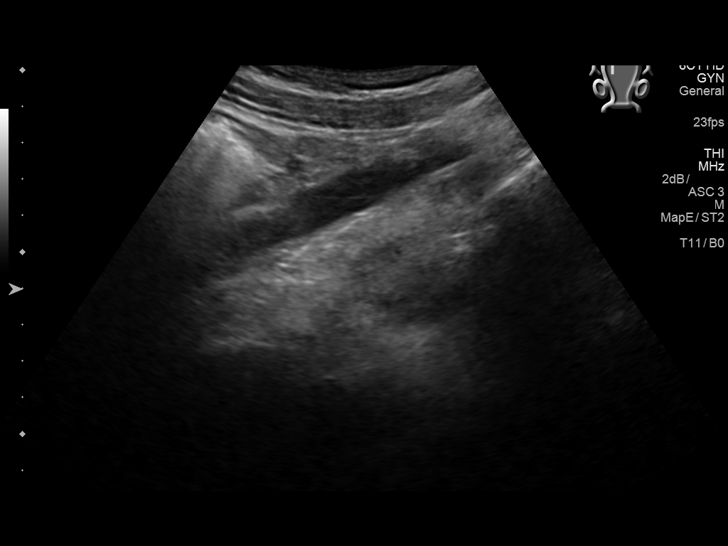
[im 10/39]
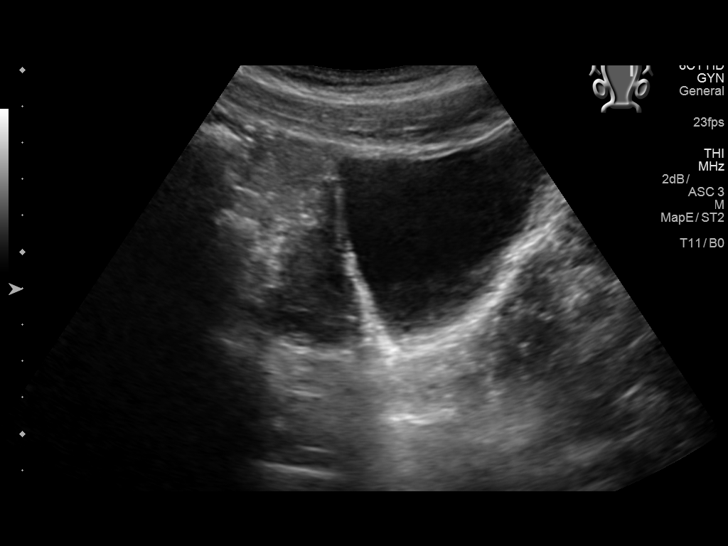
[im 13/39]
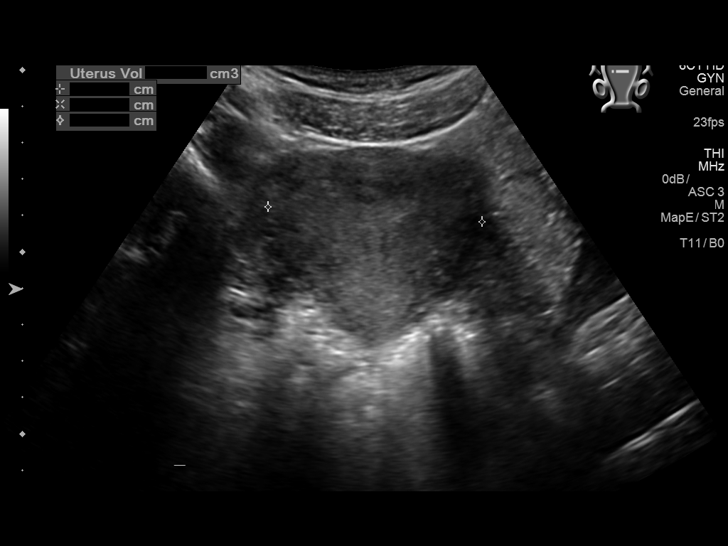
[im 15/39]
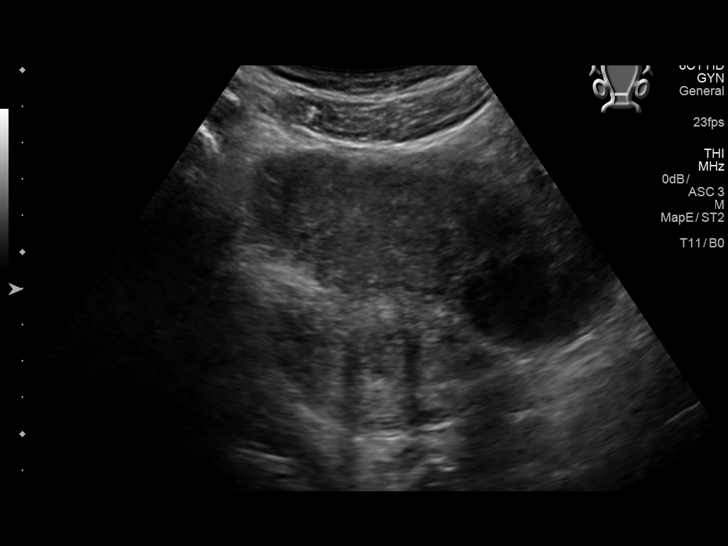
[im 18/39]
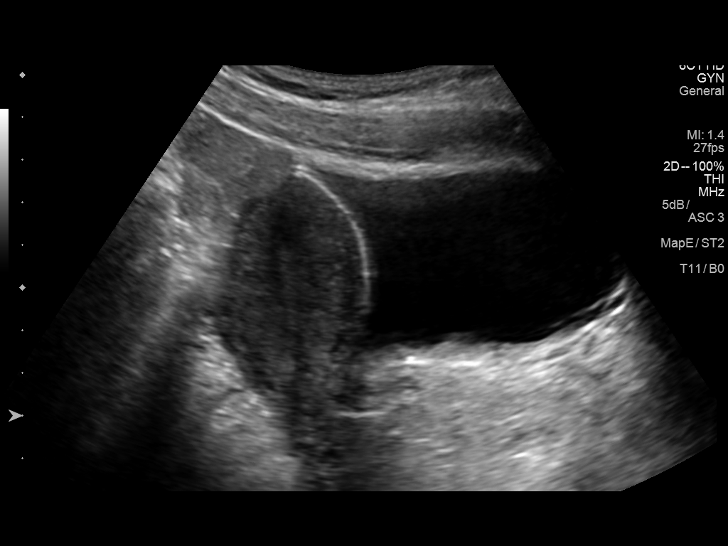
[im 21/39]
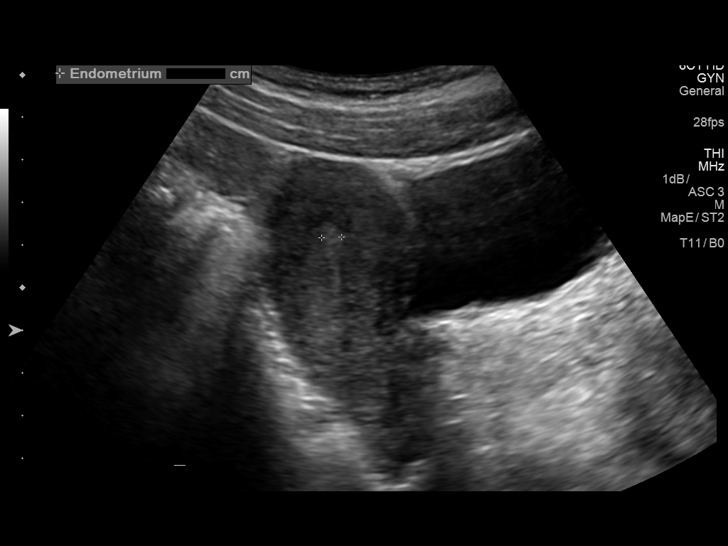
[im 24/39]
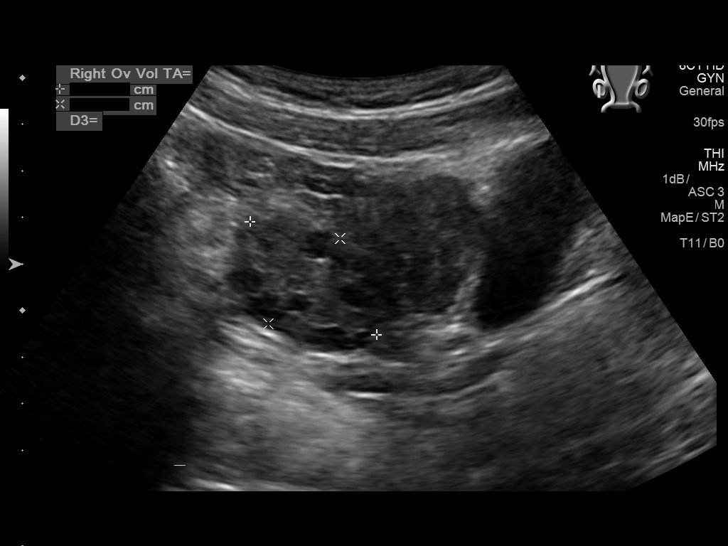
[im 26/39]
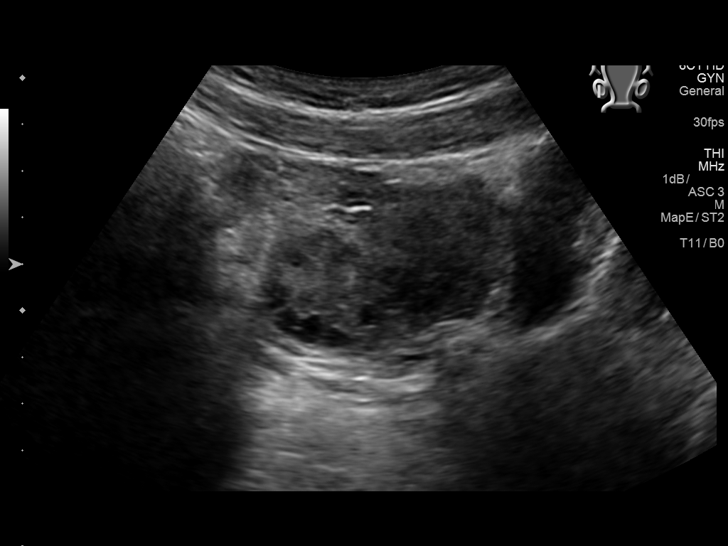
[im 29/39]
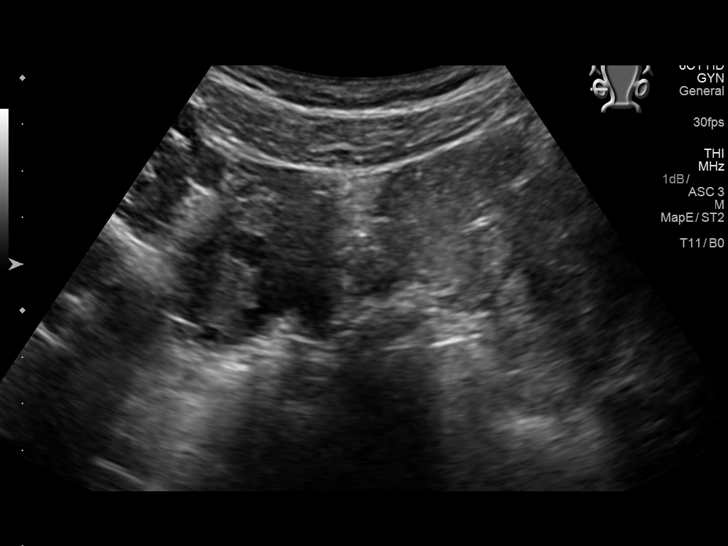
[im 32/39]
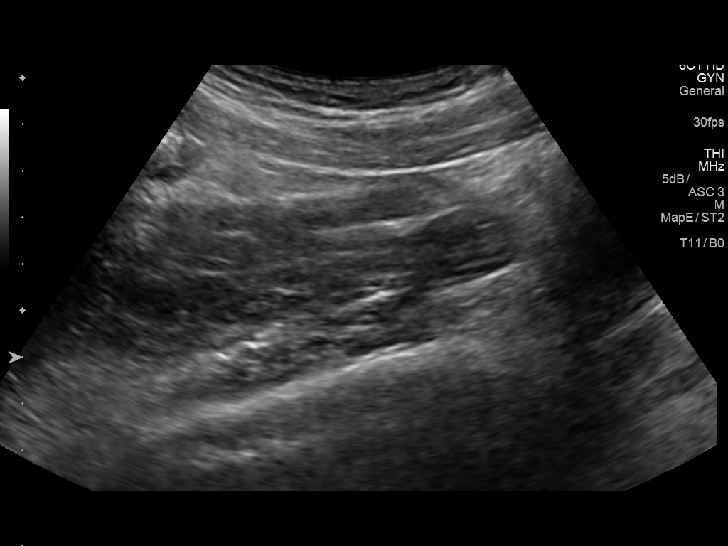
[im 35/39]
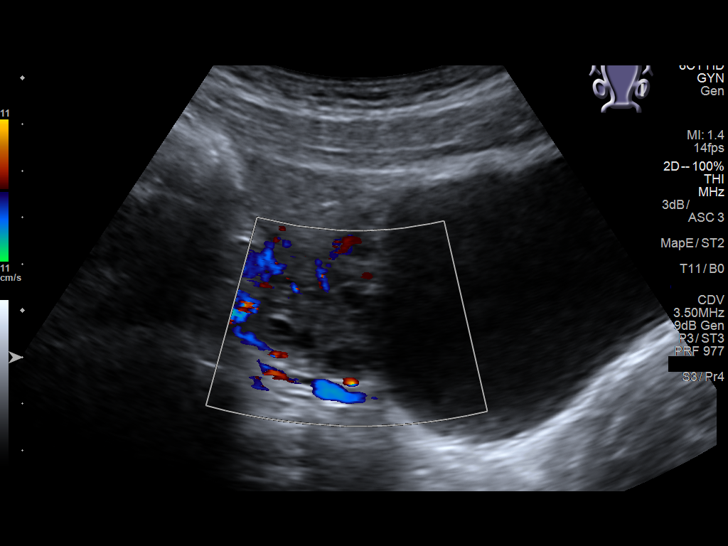
[im 39/39]
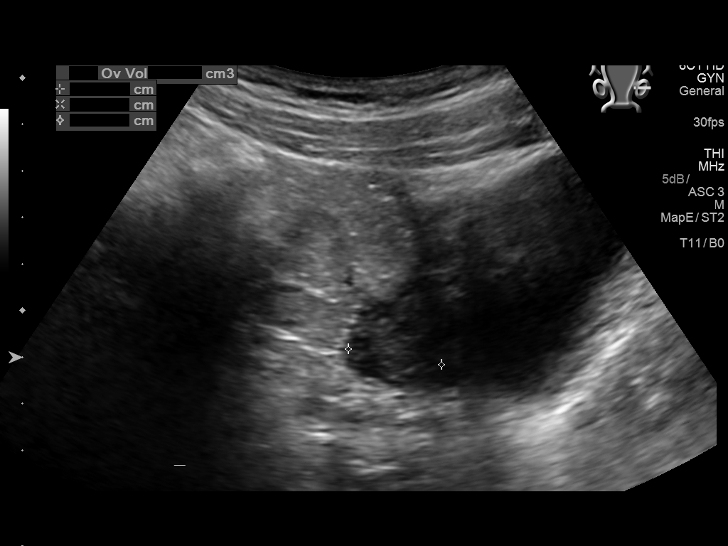

[14 of 25 positions shown; findings below may reference images not displayed]

FINDINGS: Uterus

Measurements: 7.6 x 3.7 x 5.9 cm = volume: 87.4 mL. No fibroids or
other mass visualized.

Endometrium

Thickness: 4.7 mm.  No focal abnormality visualized.

Right ovary

Measurements: 3.6 x 2.4 x 2.2 cm = volume: 9.9 mL. Normal
appearance/no adnexal mass.

Left ovary

Measurements: 3.3 x 2.2 x 2.0 cm = volume: 7.8 mL. Normal
appearance/no adnexal mass.

Other findings:  No abnormal free fluid.
IMPRESSION: Normal pelvic ultrasound.

## 2020-01-27 ENCOUNTER — Ambulatory Visit: Payer: Self-pay

## 2020-02-25 DIAGNOSIS — H5203 Hypermetropia, bilateral: Secondary | ICD-10-CM | POA: Diagnosis not present

## 2020-05-05 DIAGNOSIS — H9203 Otalgia, bilateral: Secondary | ICD-10-CM | POA: Diagnosis not present

## 2020-05-05 DIAGNOSIS — M2669 Other specified disorders of temporomandibular joint: Secondary | ICD-10-CM | POA: Diagnosis not present

## 2020-05-13 DIAGNOSIS — B373 Candidiasis of vulva and vagina: Secondary | ICD-10-CM | POA: Diagnosis not present

## 2020-06-29 DIAGNOSIS — H903 Sensorineural hearing loss, bilateral: Secondary | ICD-10-CM | POA: Diagnosis not present

## 2020-06-29 DIAGNOSIS — M26609 Unspecified temporomandibular joint disorder, unspecified side: Secondary | ICD-10-CM | POA: Diagnosis not present

## 2020-06-29 DIAGNOSIS — H93293 Other abnormal auditory perceptions, bilateral: Secondary | ICD-10-CM | POA: Diagnosis not present

## 2020-09-19 DIAGNOSIS — Z20822 Contact with and (suspected) exposure to covid-19: Secondary | ICD-10-CM | POA: Diagnosis not present

## 2020-10-21 DIAGNOSIS — R5383 Other fatigue: Secondary | ICD-10-CM | POA: Diagnosis not present

## 2020-10-21 DIAGNOSIS — Z0000 Encounter for general adult medical examination without abnormal findings: Secondary | ICD-10-CM | POA: Diagnosis not present

## 2020-10-21 DIAGNOSIS — R002 Palpitations: Secondary | ICD-10-CM | POA: Diagnosis not present

## 2020-10-21 DIAGNOSIS — R42 Dizziness and giddiness: Secondary | ICD-10-CM | POA: Diagnosis not present

## 2020-10-22 DIAGNOSIS — R5383 Other fatigue: Secondary | ICD-10-CM | POA: Diagnosis not present

## 2020-10-22 DIAGNOSIS — R002 Palpitations: Secondary | ICD-10-CM | POA: Diagnosis not present

## 2020-10-22 DIAGNOSIS — R42 Dizziness and giddiness: Secondary | ICD-10-CM | POA: Diagnosis not present

## 2020-10-22 DIAGNOSIS — Z0000 Encounter for general adult medical examination without abnormal findings: Secondary | ICD-10-CM | POA: Diagnosis not present

## 2020-11-02 DIAGNOSIS — R002 Palpitations: Secondary | ICD-10-CM | POA: Diagnosis not present

## 2020-11-04 ENCOUNTER — Other Ambulatory Visit: Payer: Self-pay | Admitting: Family Medicine

## 2020-11-04 DIAGNOSIS — N926 Irregular menstruation, unspecified: Secondary | ICD-10-CM

## 2020-11-16 DIAGNOSIS — M2669 Other specified disorders of temporomandibular joint: Secondary | ICD-10-CM | POA: Diagnosis not present

## 2020-11-16 DIAGNOSIS — M25551 Pain in right hip: Secondary | ICD-10-CM | POA: Diagnosis not present

## 2020-11-16 DIAGNOSIS — M542 Cervicalgia: Secondary | ICD-10-CM | POA: Diagnosis not present

## 2020-11-18 ENCOUNTER — Ambulatory Visit
Admission: RE | Admit: 2020-11-18 | Discharge: 2020-11-18 | Disposition: A | Discharge status: Home / Self Care | Payer: BC Managed Care – PPO | Source: Ambulatory Visit | Attending: Family Medicine | Admitting: Family Medicine

## 2020-11-18 ENCOUNTER — Other Ambulatory Visit: Payer: Self-pay

## 2020-11-18 DIAGNOSIS — N926 Irregular menstruation, unspecified: Secondary | ICD-10-CM

## 2020-11-20 DIAGNOSIS — M2669 Other specified disorders of temporomandibular joint: Secondary | ICD-10-CM | POA: Diagnosis not present

## 2020-11-20 DIAGNOSIS — M542 Cervicalgia: Secondary | ICD-10-CM | POA: Diagnosis not present

## 2020-11-20 DIAGNOSIS — M25551 Pain in right hip: Secondary | ICD-10-CM | POA: Diagnosis not present

## 2020-11-23 DIAGNOSIS — M25551 Pain in right hip: Secondary | ICD-10-CM | POA: Diagnosis not present

## 2020-11-23 DIAGNOSIS — M2669 Other specified disorders of temporomandibular joint: Secondary | ICD-10-CM | POA: Diagnosis not present

## 2020-11-23 DIAGNOSIS — M542 Cervicalgia: Secondary | ICD-10-CM | POA: Diagnosis not present

## 2020-11-25 DIAGNOSIS — M25551 Pain in right hip: Secondary | ICD-10-CM | POA: Diagnosis not present

## 2020-11-25 DIAGNOSIS — M2669 Other specified disorders of temporomandibular joint: Secondary | ICD-10-CM | POA: Diagnosis not present

## 2020-11-25 DIAGNOSIS — M542 Cervicalgia: Secondary | ICD-10-CM | POA: Diagnosis not present

## 2020-11-27 DIAGNOSIS — E282 Polycystic ovarian syndrome: Secondary | ICD-10-CM | POA: Diagnosis not present

## 2020-11-27 DIAGNOSIS — N921 Excessive and frequent menstruation with irregular cycle: Secondary | ICD-10-CM | POA: Diagnosis not present

## 2020-12-01 DIAGNOSIS — M25551 Pain in right hip: Secondary | ICD-10-CM | POA: Diagnosis not present

## 2020-12-01 DIAGNOSIS — M542 Cervicalgia: Secondary | ICD-10-CM | POA: Diagnosis not present

## 2020-12-01 DIAGNOSIS — M2669 Other specified disorders of temporomandibular joint: Secondary | ICD-10-CM | POA: Diagnosis not present

## 2020-12-03 DIAGNOSIS — M2669 Other specified disorders of temporomandibular joint: Secondary | ICD-10-CM | POA: Diagnosis not present

## 2020-12-03 DIAGNOSIS — M542 Cervicalgia: Secondary | ICD-10-CM | POA: Diagnosis not present

## 2020-12-03 DIAGNOSIS — M25551 Pain in right hip: Secondary | ICD-10-CM | POA: Diagnosis not present

## 2020-12-07 DIAGNOSIS — M2669 Other specified disorders of temporomandibular joint: Secondary | ICD-10-CM | POA: Diagnosis not present

## 2020-12-07 DIAGNOSIS — M25551 Pain in right hip: Secondary | ICD-10-CM | POA: Diagnosis not present

## 2020-12-07 DIAGNOSIS — M542 Cervicalgia: Secondary | ICD-10-CM | POA: Diagnosis not present

## 2020-12-11 DIAGNOSIS — M25551 Pain in right hip: Secondary | ICD-10-CM | POA: Diagnosis not present

## 2020-12-11 DIAGNOSIS — M2669 Other specified disorders of temporomandibular joint: Secondary | ICD-10-CM | POA: Diagnosis not present

## 2020-12-11 DIAGNOSIS — M542 Cervicalgia: Secondary | ICD-10-CM | POA: Diagnosis not present

## 2020-12-15 DIAGNOSIS — M25551 Pain in right hip: Secondary | ICD-10-CM | POA: Diagnosis not present

## 2020-12-15 DIAGNOSIS — M2669 Other specified disorders of temporomandibular joint: Secondary | ICD-10-CM | POA: Diagnosis not present

## 2020-12-15 DIAGNOSIS — M542 Cervicalgia: Secondary | ICD-10-CM | POA: Diagnosis not present

## 2020-12-18 DIAGNOSIS — M2669 Other specified disorders of temporomandibular joint: Secondary | ICD-10-CM | POA: Diagnosis not present

## 2020-12-18 DIAGNOSIS — M25551 Pain in right hip: Secondary | ICD-10-CM | POA: Diagnosis not present

## 2020-12-18 DIAGNOSIS — M542 Cervicalgia: Secondary | ICD-10-CM | POA: Diagnosis not present

## 2020-12-21 DIAGNOSIS — M2669 Other specified disorders of temporomandibular joint: Secondary | ICD-10-CM | POA: Diagnosis not present

## 2020-12-21 DIAGNOSIS — M542 Cervicalgia: Secondary | ICD-10-CM | POA: Diagnosis not present

## 2020-12-21 DIAGNOSIS — M25551 Pain in right hip: Secondary | ICD-10-CM | POA: Diagnosis not present

## 2020-12-24 DIAGNOSIS — M25551 Pain in right hip: Secondary | ICD-10-CM | POA: Diagnosis not present

## 2020-12-24 DIAGNOSIS — M2669 Other specified disorders of temporomandibular joint: Secondary | ICD-10-CM | POA: Diagnosis not present

## 2020-12-24 DIAGNOSIS — M542 Cervicalgia: Secondary | ICD-10-CM | POA: Diagnosis not present

## 2020-12-28 DIAGNOSIS — M25551 Pain in right hip: Secondary | ICD-10-CM | POA: Diagnosis not present

## 2020-12-28 DIAGNOSIS — M542 Cervicalgia: Secondary | ICD-10-CM | POA: Diagnosis not present

## 2020-12-28 DIAGNOSIS — M545 Low back pain, unspecified: Secondary | ICD-10-CM | POA: Diagnosis not present

## 2020-12-28 DIAGNOSIS — M2669 Other specified disorders of temporomandibular joint: Secondary | ICD-10-CM | POA: Diagnosis not present

## 2021-01-01 DIAGNOSIS — M25551 Pain in right hip: Secondary | ICD-10-CM | POA: Diagnosis not present

## 2021-01-01 DIAGNOSIS — M542 Cervicalgia: Secondary | ICD-10-CM | POA: Diagnosis not present

## 2021-01-01 DIAGNOSIS — R110 Nausea: Secondary | ICD-10-CM | POA: Diagnosis not present

## 2021-01-01 DIAGNOSIS — M2669 Other specified disorders of temporomandibular joint: Secondary | ICD-10-CM | POA: Diagnosis not present

## 2021-01-01 DIAGNOSIS — E282 Polycystic ovarian syndrome: Secondary | ICD-10-CM | POA: Diagnosis not present

## 2021-01-01 DIAGNOSIS — M545 Low back pain, unspecified: Secondary | ICD-10-CM | POA: Diagnosis not present

## 2021-01-05 DIAGNOSIS — M25551 Pain in right hip: Secondary | ICD-10-CM | POA: Diagnosis not present

## 2021-01-05 DIAGNOSIS — M542 Cervicalgia: Secondary | ICD-10-CM | POA: Diagnosis not present

## 2021-01-05 DIAGNOSIS — M2669 Other specified disorders of temporomandibular joint: Secondary | ICD-10-CM | POA: Diagnosis not present

## 2021-01-05 DIAGNOSIS — M545 Low back pain, unspecified: Secondary | ICD-10-CM | POA: Diagnosis not present

## 2021-01-06 DIAGNOSIS — J029 Acute pharyngitis, unspecified: Secondary | ICD-10-CM | POA: Diagnosis not present

## 2021-01-06 DIAGNOSIS — H9202 Otalgia, left ear: Secondary | ICD-10-CM | POA: Diagnosis not present

## 2021-01-08 DIAGNOSIS — M542 Cervicalgia: Secondary | ICD-10-CM | POA: Diagnosis not present

## 2021-01-08 DIAGNOSIS — M25551 Pain in right hip: Secondary | ICD-10-CM | POA: Diagnosis not present

## 2021-01-08 DIAGNOSIS — M545 Low back pain, unspecified: Secondary | ICD-10-CM | POA: Diagnosis not present

## 2021-01-08 DIAGNOSIS — M2669 Other specified disorders of temporomandibular joint: Secondary | ICD-10-CM | POA: Diagnosis not present

## 2021-01-11 DIAGNOSIS — M2669 Other specified disorders of temporomandibular joint: Secondary | ICD-10-CM | POA: Diagnosis not present

## 2021-01-11 DIAGNOSIS — M542 Cervicalgia: Secondary | ICD-10-CM | POA: Diagnosis not present

## 2021-01-11 DIAGNOSIS — M25551 Pain in right hip: Secondary | ICD-10-CM | POA: Diagnosis not present

## 2021-01-11 DIAGNOSIS — M545 Low back pain, unspecified: Secondary | ICD-10-CM | POA: Diagnosis not present

## 2021-01-13 DIAGNOSIS — M545 Low back pain, unspecified: Secondary | ICD-10-CM | POA: Diagnosis not present

## 2021-01-13 DIAGNOSIS — M2669 Other specified disorders of temporomandibular joint: Secondary | ICD-10-CM | POA: Diagnosis not present

## 2021-01-13 DIAGNOSIS — M542 Cervicalgia: Secondary | ICD-10-CM | POA: Diagnosis not present

## 2021-01-13 DIAGNOSIS — M25551 Pain in right hip: Secondary | ICD-10-CM | POA: Diagnosis not present

## 2021-01-19 DIAGNOSIS — M2669 Other specified disorders of temporomandibular joint: Secondary | ICD-10-CM | POA: Diagnosis not present

## 2021-01-19 DIAGNOSIS — M25551 Pain in right hip: Secondary | ICD-10-CM | POA: Diagnosis not present

## 2021-01-19 DIAGNOSIS — M545 Low back pain, unspecified: Secondary | ICD-10-CM | POA: Diagnosis not present

## 2021-01-19 DIAGNOSIS — M542 Cervicalgia: Secondary | ICD-10-CM | POA: Diagnosis not present

## 2021-01-22 DIAGNOSIS — M25551 Pain in right hip: Secondary | ICD-10-CM | POA: Diagnosis not present

## 2021-01-22 DIAGNOSIS — M2669 Other specified disorders of temporomandibular joint: Secondary | ICD-10-CM | POA: Diagnosis not present

## 2021-01-22 DIAGNOSIS — M542 Cervicalgia: Secondary | ICD-10-CM | POA: Diagnosis not present

## 2021-01-22 DIAGNOSIS — M545 Low back pain, unspecified: Secondary | ICD-10-CM | POA: Diagnosis not present

## 2021-01-29 DIAGNOSIS — M2669 Other specified disorders of temporomandibular joint: Secondary | ICD-10-CM | POA: Diagnosis not present

## 2021-01-29 DIAGNOSIS — M542 Cervicalgia: Secondary | ICD-10-CM | POA: Diagnosis not present

## 2021-01-29 DIAGNOSIS — M25551 Pain in right hip: Secondary | ICD-10-CM | POA: Diagnosis not present

## 2021-01-29 DIAGNOSIS — M545 Low back pain, unspecified: Secondary | ICD-10-CM | POA: Diagnosis not present

## 2022-06-09 DIAGNOSIS — I493 Ventricular premature depolarization: Secondary | ICD-10-CM | POA: Diagnosis not present

## 2022-06-09 DIAGNOSIS — I4510 Unspecified right bundle-branch block: Secondary | ICD-10-CM | POA: Diagnosis not present

## 2022-06-15 DIAGNOSIS — I4510 Unspecified right bundle-branch block: Secondary | ICD-10-CM | POA: Diagnosis not present
